# Patient Record
Sex: Male | Born: 1960 | Race: Black or African American | Hispanic: No | Marital: Married | Smoking: Never smoker
Health system: Southern US, Community
[De-identification: ages and names within clinical notes are randomized; demographics above are authoritative.]

---

## 1999-04-28 ENCOUNTER — Encounter (INDEPENDENT_AMBULATORY_CARE_PROVIDER_SITE_OTHER): Payer: Self-pay

## 1999-04-28 ENCOUNTER — Ambulatory Visit (HOSPITAL_COMMUNITY): Admission: RE | Admit: 1999-04-28 | Discharge: 1999-04-28 | Payer: Self-pay | Admitting: Gastroenterology

## 2005-01-05 ENCOUNTER — Ambulatory Visit: Payer: Self-pay | Admitting: Gastroenterology

## 2005-01-14 ENCOUNTER — Ambulatory Visit: Payer: Self-pay | Admitting: Gastroenterology

## 2009-02-08 ENCOUNTER — Emergency Department (HOSPITAL_COMMUNITY): Admission: EM | Admit: 2009-02-08 | Discharge: 2009-02-08 | Payer: Self-pay | Admitting: Emergency Medicine

## 2009-05-11 ENCOUNTER — Inpatient Hospital Stay (HOSPITAL_COMMUNITY): Admission: AC | Admit: 2009-05-11 | Discharge: 2009-05-13 | Payer: Self-pay

## 2009-05-13 ENCOUNTER — Encounter (INDEPENDENT_AMBULATORY_CARE_PROVIDER_SITE_OTHER): Payer: Self-pay | Admitting: General Surgery

## 2009-05-13 ENCOUNTER — Ambulatory Visit: Payer: Self-pay | Admitting: Vascular Surgery

## 2009-12-11 ENCOUNTER — Encounter (INDEPENDENT_AMBULATORY_CARE_PROVIDER_SITE_OTHER): Payer: Self-pay | Admitting: *Deleted

## 2010-05-13 NOTE — Letter (Signed)
Summary: Colonoscopy Letter  [facility]  [address] [place], [place] [address]   Phone: [phone]  Fax: [phone]      December 11, 2009 MRN: [id number]   [facility] [address] [place], [place]  [address]   Dear Christopher Mayer,   According to your medical record, it is time for you to schedule a Colonoscopy. The American Cancer Society recommends this procedure as a method to detect early colon cancer. Patients with a family history of colon cancer, or a personal history of colon polyps or inflammatory bowel disease are at increased risk.  This letter has beeen generated based on the recommendations made at the time of your procedure. If you feel that in your particular situation this may no longer apply, please contact our office.  Please call our office at [phone] to schedule this appointment or to update your records at your earliest convenience.  Thank you for cooperating with [place] to provide you with the very best care possible.   Sincerely,  Judie Petit T. Russella Dar, M.D.  [facility] [phone]

## 2010-06-30 LAB — CBC
HCT: 36.8 % — ABNORMAL LOW (ref 39.0–52.0)
HCT: 37.8 % — ABNORMAL LOW (ref 39.0–52.0)
Hemoglobin: 12.5 g/dL — ABNORMAL LOW (ref 13.0–17.0)
MCV: 91.7 fL (ref 78.0–100.0)
MCV: 92.1 fL (ref 78.0–100.0)
Platelets: 159 10*3/uL (ref 150–400)
Platelets: 167 10*3/uL (ref 150–400)
RBC: 4.11 MIL/uL — ABNORMAL LOW (ref 4.22–5.81)
WBC: 10 10*3/uL (ref 4.0–10.5)

## 2010-06-30 LAB — POCT I-STAT, CHEM 8
Calcium, Ion: 1.13 mmol/L (ref 1.12–1.32)
HCT: 41 % (ref 39.0–52.0)
Potassium: 3.3 meq/L — ABNORMAL LOW (ref 3.5–5.1)
TCO2: 22 mmol/L (ref 0–100)

## 2010-06-30 LAB — URINALYSIS, ROUTINE W REFLEX MICROSCOPIC
Bilirubin Urine: NEGATIVE
Ketones, ur: 15 mg/dL — AB
Nitrite: NEGATIVE
Protein, ur: NEGATIVE mg/dL
Specific Gravity, Urine: 1.018 (ref 1.005–1.030)

## 2010-06-30 LAB — BASIC METABOLIC PANEL
Calcium: 8.2 mg/dL — ABNORMAL LOW (ref 8.4–10.5)
Creatinine, Ser: 1.24 mg/dL (ref 0.4–1.5)
GFR calc non Af Amer: >60 mL/min (ref >60)

## 2010-06-30 LAB — GLUCOSE, CAPILLARY
Glucose-Capillary: 179 mg/dL — ABNORMAL HIGH (ref 70–99)
Glucose-Capillary: 193 mg/dL — ABNORMAL HIGH (ref 70–99)
Glucose-Capillary: 201 mg/dL — ABNORMAL HIGH (ref 70–99)
Glucose-Capillary: 243 mg/dL — ABNORMAL HIGH (ref 70–99)

## 2010-06-30 LAB — URINE CULTURE

## 2010-07-17 LAB — POCT I-STAT, CHEM 8
BUN: 5 mg/dL — ABNORMAL LOW (ref 6–23)
Calcium, Ion: 1.13 mmol/L (ref 1.12–1.32)
Chloride: 105 mEq/L (ref 96–112)
Creatinine, Ser: 1 mg/dL (ref 0.4–1.5)
HCT: 45 % (ref 39.0–52.0)

## 2010-07-17 LAB — COMPREHENSIVE METABOLIC PANEL
ALT: 25 U/L (ref 0–53)
Alkaline Phosphatase: 79 U/L (ref 39–117)
Calcium: 8.9 mg/dL (ref 8.4–10.5)
Chloride: 108 mEq/L (ref 96–112)
Creatinine, Ser: 1.09 mg/dL (ref 0.4–1.5)
Total Bilirubin: 1.2 mg/dL (ref 0.3–1.2)
Total Protein: 6.9 g/dL (ref 6.0–8.3)

## 2010-07-17 LAB — APTT: aPTT: 30 seconds (ref 24–37)

## 2010-07-17 LAB — CBC
MCV: 90.5 fL (ref 78.0–100.0)
Platelets: 225 10*3/uL (ref 150–400)
RDW: 14.3 % (ref 11.5–15.5)

## 2010-07-17 LAB — POCT CARDIAC MARKERS
CKMB, poc: <1 ng/mL — ABNORMAL LOW (ref 1.0–8.0)
CKMB, poc: <1 ng/mL — ABNORMAL LOW (ref 1.0–8.0)
Myoglobin, poc: 83.1 ng/mL (ref 12–200)
Troponin i, poc: <0.05 ng/mL (ref 0.00–0.09)
Troponin i, poc: <0.05 ng/mL (ref 0.00–0.09)

## 2010-07-17 LAB — CK TOTAL AND CKMB (NOT AT ARMC)
CK, MB: 1.1 ng/mL (ref 0.3–4.0)
Relative Index: 0.6 (ref 0.0–2.5)
Total CK: 196 U/L (ref 7–232)

## 2010-07-17 LAB — DIFFERENTIAL
Lymphs Abs: 1.3 10*3/uL (ref 0.7–4.0)
Neutro Abs: 7.4 10*3/uL (ref 1.7–7.7)
Neutrophils Relative %: 80 % — ABNORMAL HIGH (ref 43–77)

## 2010-07-17 LAB — LIPASE, BLOOD: Lipase: 20 U/L (ref 11–59)

## 2010-07-17 LAB — D-DIMER, QUANTITATIVE: D-Dimer, Quant: <0.22 ug/mL-FEU (ref 0.00–0.48)

## 2010-07-17 LAB — PROTIME-INR: INR: 0.96 (ref 0.00–1.49)
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
## 2011-12-31 ENCOUNTER — Encounter: Payer: Self-pay | Admitting: Gastroenterology

## 2013-09-28 ENCOUNTER — Encounter: Payer: Self-pay | Admitting: Gastroenterology

## 2020-03-05 ENCOUNTER — Encounter (INDEPENDENT_AMBULATORY_CARE_PROVIDER_SITE_OTHER): Payer: Self-pay | Admitting: Ophthalmology

## 2020-03-05 ENCOUNTER — Other Ambulatory Visit: Payer: Self-pay

## 2020-03-05 ENCOUNTER — Ambulatory Visit (INDEPENDENT_AMBULATORY_CARE_PROVIDER_SITE_OTHER): Payer: Commercial Managed Care - PPO | Admitting: Ophthalmology

## 2020-03-05 DIAGNOSIS — R0683 Snoring: Secondary | ICD-10-CM | POA: Diagnosis not present

## 2020-03-05 DIAGNOSIS — H35033 Hypertensive retinopathy, bilateral: Secondary | ICD-10-CM | POA: Diagnosis not present

## 2020-03-05 DIAGNOSIS — E113412 Type 2 diabetes mellitus with severe nonproliferative diabetic retinopathy with macular edema, left eye: Secondary | ICD-10-CM | POA: Diagnosis not present

## 2020-03-05 DIAGNOSIS — E113491 Type 2 diabetes mellitus with severe nonproliferative diabetic retinopathy without macular edema, right eye: Secondary | ICD-10-CM | POA: Insufficient documentation

## 2020-03-05 NOTE — Assessment & Plan Note (Signed)
CSME OS, at non center involved, will need fluorescein angiography in the near future

## 2020-03-05 NOTE — Assessment & Plan Note (Addendum)
Patient snores, has nocturia, and with labile hypertension  Recommend sleep apnea evaluation

## 2020-03-05 NOTE — Progress Notes (Signed)
03/05/2020     CHIEF COMPLAINT Patient presents for Diabetic Eye Exam   HISTORY OF PRESENT ILLNESS: Christopher Mayer is a 59 y.o. male who presents to the clinic today for:   HPI    Diabetic Eye Exam    Vision is blurred for near and is blurred for distance.  Diabetes characteristics include Type 2.  This started 3 weeks ago.  Blood sugar level is uncontrolled.  Last Blood Glucose: Does not recall.  Last A1C: Unknown, "I know it was high.".          Comments    NP Diabetic Exam OU  Pt c/o difficulty focusing OU with new glasses x 2-3 weeks. Pt reports uncontrolled blood sugar, and sts A1c "was high."        Last edited by Ileana Roup, COA on 03/05/2020  9:01 AM. (History)      Referring physician: N/A  HISTORICAL INFORMATION:   Selected notes from the MEDICAL RECORD NUMBER       CURRENT MEDICATIONS: No current outpatient medications on file. (Ophthalmic Drugs)   No current facility-administered medications for this visit. (Ophthalmic Drugs)   No current outpatient medications on file. (Other)   No current facility-administered medications for this visit. (Other)      REVIEW OF SYSTEMS:    ALLERGIES No Known Allergies  PAST MEDICAL HISTORY History reviewed. No pertinent past medical history. History reviewed. No pertinent surgical history.  FAMILY HISTORY History reviewed. No pertinent family history.  SOCIAL HISTORY Social History   Tobacco Use  . Smoking status: Never Smoker  . Smokeless tobacco: Never Used  Substance Use Topics  . Alcohol use: Not on file  . Drug use: Not on file         OPHTHALMIC EXAM:  Base Eye Exam    Visual Acuity (ETDRS)      Right Left   Dist cc 20/20 -2 20/20 -1   Correction: Glasses       Tonometry (Tonopen, 9:01 AM)      Right Left   Pressure 22 21       Pupils      Pupils Dark Light Shape React APD   Right PERRL 6 5 Round Brisk None   Left PERRL 6 5 Round Brisk None       Visual Fields  (Counting fingers)      Left Right    Full Full       Extraocular Movement      Right Left    Full Full       Neuro/Psych    Oriented x3: Yes   Mood/Affect: Normal       Dilation    Both eyes: 1.0% Mydriacyl, 2.5% Phenylephrine @ 9:05 AM        Slit Lamp and Fundus Exam    External Exam      Right Left   External Normal Normal       Slit Lamp Exam      Right Left   Lids/Lashes Normal Normal   Conjunctiva/Sclera White and quiet White and quiet   Cornea Clear Clear   Anterior Chamber Deep and quiet Deep and quiet   Iris Round and reactive Round and reactive   Anterior Vitreous Normal Normal       Fundus Exam      Right Left   Posterior Vitreous Normal Normal   Disc Normal Normal   C/D Ratio 0.5 0.5   Macula Microaneurysms, Mild clinically significant  macular edema, Exudates Microaneurysms, Mild clinically significant macular edema, Exudates   Vessels NPDR-Severe, Retinopathy, Tortuous, AV nicking NPDR-Severe, Retinopathy, Tortuous, AV nicking   Periphery Cotton wool spots  Numerous, in posterior pole Cotton wool spots  Numerous, in posterior pole          IMAGING AND PROCEDURES  Imaging and Procedures for 03/05/20  OCT, Retina - OU - Both Eyes       Right Eye Quality was good. Scan locations included subfoveal. Central Foveal Thickness: 292. Findings include abnormal foveal contour, retinal drusen .   Left Eye Quality was good. Scan locations included subfoveal. Central Foveal Thickness: 285. Progression has no prior data. Findings include abnormal foveal contour, retinal drusen .   Notes OS with noncentral involved CSME superior to the fovea.  Will need for cineangiography OU and likely focal laser photocoagulation left eye at that time                ASSESSMENT/PLAN:  Severe nonproliferative diabetic retinopathy of left eye with macular edema associated with type 2 diabetes mellitus (HCC) CSME OS, at non center involved, will need  fluorescein angiography in the near future  Hypertensive retinopathy of both eyes, grade 2 Detected on clinical examination with numerous multiple posterior pole cotton-wool spots in each eye.  I also suspect this patient may have undiagnosed sleep apnea as review of systems are somewhat positive for that condition and this is the appearance seen in patients with severe nonproliferative diabetic retinopathy with multiple cotton-wool spots posteriorly  Snores Patient snores, has nocturia, and with labile hypertension  Recommend sleep apnea evaluation      ICD-10-CM   1. Severe nonproliferative diabetic retinopathy of right eye without macular edema associated with type 2 diabetes mellitus (HCC)  E11.3491 OCT, Retina - OU - Both Eyes  2. Severe nonproliferative diabetic retinopathy of left eye with macular edema associated with type 2 diabetes mellitus (HCC)  E11.3412 OCT, Retina - OU - Both Eyes  3. Hypertensive retinopathy of both eyes, grade 2  H35.033 OCT, Retina - OU - Both Eyes  4. Snores  R06.83     1.  Patient will seek consultation with his primary care physician the physician formally known as Dr. Otilio Miu, , now married,  (in [place])  2.  Patient to return for dilated examination OU fluorescein angiography OU, and consideration of focal laser treatment left eye  3.  Ophthalmic Meds Ordered this visit:  No orders of the defined types were placed in this encounter.      Return in about 2 weeks (around 03/19/2020) for DILATE OU, COLOR FP, OPTOS FFA L/R, FOCAL, OS.  There are no Patient Instructions on file for this visit.   Explained the diagnoses, plan, and follow up with the patient and they expressed understanding.  Patient expressed understanding of the importance of proper follow up care.   Alford Highland Henya Aguallo M.D. Diseases & Surgery of the Retina and Vitreous [facility] 03/05/20     Abbreviations: M myopia (nearsighted); A  astigmatism; H hyperopia (farsighted); P presbyopia; Mrx spectacle prescription;  CTL contact lenses; OD right eye; OS left eye; OU both eyes  XT exotropia; ET esotropia; PEK punctate epithelial keratitis; PEE punctate epithelial erosions; DES dry eye syndrome; MGD meibomian gland dysfunction; ATs artificial tears; PFAT's preservative free artificial tears; NSC nuclear sclerotic cataract; PSC posterior subcapsular cataract; ERM epi-retinal membrane; PVD posterior vitreous detachment; RD retinal detachment; DM diabetes mellitus; DR diabetic retinopathy; NPDR  non-proliferative diabetic retinopathy; PDR proliferative diabetic retinopathy; CSME clinically significant macular edema; DME diabetic macular edema; dbh dot blot hemorrhages; CWS cotton wool spot; POAG primary open angle glaucoma; C/D cup-to-disc ratio; HVF humphrey visual field; GVF goldmann visual field; OCT optical coherence tomography; IOP intraocular pressure; BRVO Branch retinal vein occlusion; CRVO central retinal vein occlusion; CRAO central retinal artery occlusion; BRAO branch retinal artery occlusion; RT retinal tear; SB scleral buckle; PPV pars plana vitrectomy; VH Vitreous hemorrhage; PRP panretinal laser photocoagulation; IVK intravitreal kenalog; VMT vitreomacular traction; MH Macular hole;  NVD neovascularization of the disc; NVE neovascularization elsewhere; AREDS age related eye disease study; ARMD age related macular degeneration; POAG primary open angle glaucoma; EBMD epithelial/anterior basement membrane dystrophy; ACIOL anterior chamber intraocular lens; IOL intraocular lens; PCIOL posterior chamber intraocular lens; Phaco/IOL phacoemulsification with intraocular lens placement; [place] photorefractive keratectomy; LASIK laser assisted in situ keratomileusis; HTN hypertension; DM diabetes mellitus; COPD chronic obstructive pulmonary disease

## 2020-03-05 NOTE — Assessment & Plan Note (Signed)
Detected on clinical examination with numerous multiple posterior pole cotton-wool spots in each eye.  I also suspect this patient may have undiagnosed sleep apnea as review of systems are somewhat positive for that condition and this is the appearance seen in patients with severe nonproliferative diabetic retinopathy with multiple cotton-wool spots posteriorly

## 2020-03-19 ENCOUNTER — Encounter (INDEPENDENT_AMBULATORY_CARE_PROVIDER_SITE_OTHER): Payer: Self-pay | Admitting: Ophthalmology

## 2020-03-19 ENCOUNTER — Other Ambulatory Visit: Payer: Self-pay

## 2020-03-19 ENCOUNTER — Ambulatory Visit (INDEPENDENT_AMBULATORY_CARE_PROVIDER_SITE_OTHER): Payer: Commercial Managed Care - PPO | Admitting: Ophthalmology

## 2020-03-19 DIAGNOSIS — E113412 Type 2 diabetes mellitus with severe nonproliferative diabetic retinopathy with macular edema, left eye: Secondary | ICD-10-CM | POA: Diagnosis not present

## 2020-03-19 DIAGNOSIS — E113491 Type 2 diabetes mellitus with severe nonproliferative diabetic retinopathy without macular edema, right eye: Secondary | ICD-10-CM

## 2020-03-19 DIAGNOSIS — R0683 Snoring: Secondary | ICD-10-CM

## 2020-03-19 DIAGNOSIS — H35033 Hypertensive retinopathy, bilateral: Secondary | ICD-10-CM | POA: Diagnosis not present

## 2020-03-19 MED ORDER — FLUORESCEIN SODIUM 10 % IV SOLN
500.0000 mg | INTRAVENOUS | Status: AC | PRN
  Administered 2020-03-19: 500 mg via INTRAVENOUS

## 2020-03-19 NOTE — Assessment & Plan Note (Signed)
Also with superimposed hypertensive retinopathy.

## 2020-03-19 NOTE — Assessment & Plan Note (Signed)
Focal laser treatment OS today

## 2020-03-19 NOTE — Assessment & Plan Note (Signed)
I reiterated that the patient has positive review of systems for sleep apnea and should be tested.  I informed the patient that should his testing disclosed borderline sleep apnea or worse, treatment is likely to benefit his diabetes control, hypertension control and most importantly slow diabetic retinopathy in addition to his ongoing therapy here

## 2020-03-19 NOTE — Assessment & Plan Note (Signed)
cotton-wool spots of hypertensive retinopathy.  The posterior pole this does suggest superimposed hypertension on diabetic retinopathy.  This is most often seen in patients who have normal-tension during the day as he reports and likely has untreated or undiagnosed sleep apnea with periodic nightly hypertension triggering these types of capillary nonperfusion posteriorly.  I urged testing for sleep apnea and therapy if found to be present

## 2020-03-19 NOTE — Progress Notes (Signed)
03/19/2020     CHIEF COMPLAINT Patient presents for Retina Follow Up   HISTORY OF PRESENT ILLNESS: Christopher Mayer is a 59 y.o. male who presents to the clinic today for:   HPI    Retina Follow Up    Patient presents with  Diabetic Retinopathy.  In both eyes.  This started 2 weeks ago.  Severity is mild.  Duration of 2 weeks.  Since onset it is stable.          Comments    2 Week FFA/FP L/R, Focal OS  Pt denies noticeable changes to [place] OU since last visit. Pt denies ocular pain, flashes of light, or floaters OU.  LBS: does not recall       Last edited by Ileana Roup, COA on 03/19/2020  3:49 PM. (History)      Referring physician: No referring provider defined for this encounter.  HISTORICAL INFORMATION:   Selected notes from the MEDICAL RECORD NUMBER       CURRENT MEDICATIONS: No current outpatient medications on file. (Ophthalmic Drugs)   No current facility-administered medications for this visit. (Ophthalmic Drugs)   No current outpatient medications on file. (Other)   No current facility-administered medications for this visit. (Other)      REVIEW OF SYSTEMS:    ALLERGIES No Known Allergies  PAST MEDICAL HISTORY History reviewed. No pertinent past medical history. History reviewed. No pertinent surgical history.  FAMILY HISTORY History reviewed. No pertinent family history.  SOCIAL HISTORY Social History   Tobacco Use  . Smoking status: Never Smoker  . Smokeless tobacco: Never Used  Substance Use Topics  . Alcohol use: Not on file  . Drug use: Not on file         OPHTHALMIC EXAM: Base Eye Exam    Visual Acuity (ETDRS)      Right Left   Dist cc 20/25 +2 20/25 -2   Correction: Glasses       Tonometry (Tonopen, 3:49 PM)      Right Left   Pressure 20 18       Pupils      Pupils Dark Light Shape React APD   Right PERRL 6 5 Round Brisk None   Left PERRL 6 5 Round Brisk None       Visual Fields (Counting fingers)       Left Right    Full Full       Extraocular Movement      Right Left    Full Full       Neuro/Psych    Oriented x3: Yes   Mood/Affect: Normal       Dilation    Both eyes: 1.0% Mydriacyl, 2.5% Phenylephrine @ 3:53 PM          IMAGING AND PROCEDURES  Imaging and Procedures for 03/19/20  OCT, Retina - OU - Both Eyes       Right Eye Quality was good. Scan locations included subfoveal.   Left Eye Quality was good. Scan locations included subfoveal.   Notes For focal laser treatment today, OS superior to the fovea today       Color Fundus Photography Optos - OU - Both Eyes       Right Eye Progression has no prior data.   Left Eye Progression has no prior data.   Notes Too numerous to count OU, cotton-wool spots of hypertensive retinopathy.  The posterior pole this does suggest superimposed hypertension on diabetic retinopathy.  This is  most often seen in patients who have normal-tension during the day as he reports and likely has untreated or undiagnosed sleep apnea with periodic nightly hypertension triggering these types of capillary nonperfusion posteriorly.  I urged testing for sleep apnea and therapy if found to be present       Fluorescein Angiography Optos (Transit OS)       Injection:  500 mg Fluorescein Sodium 10 % injection   NDC: [phone]   Route: Intravenous, Site: Left ArmRight Eye   Progression has no prior data. Mid/Late phase findings include leakage. Choroidal neovascularization is not present.   Left Eye   Progression has no prior data. Early phase findings include leakage, vascular perfusion defect. Mid/Late phase findings include microaneurysm. Choroidal neovascularization is not present.   Notes CSME OS confirm  Numerous locations of capillary dropout correspond with cotton-wool spots seen on color fundus photography mostly in the posterior pole.  This pattern is most likely from superimposed hypertensive retinopathy upon  the severe nonproliferative diabetic retinopathy.  Particularly associated with UNDiagnosed and untreated sleep apnea                ASSESSMENT/PLAN:  Hypertensive retinopathy of both eyes, grade 2  cotton-wool spots of hypertensive retinopathy.  The posterior pole this does suggest superimposed hypertension on diabetic retinopathy.  This is most often seen in patients who have normal-tension during the day as he reports and likely has untreated or undiagnosed sleep apnea with periodic nightly hypertension triggering these types of capillary nonperfusion posteriorly.  I urged testing for sleep apnea and therapy if found to be present   Severe nonproliferative diabetic retinopathy of left eye with macular edema associated with type 2 diabetes mellitus (HCC) Focal laser treatment OS today  Severe nonproliferative diabetic retinopathy of right eye (HCC) Also with superimposed hypertensive retinopathy.  Snores I reiterated that the patient has positive review of systems for sleep apnea and should be tested.  I informed the patient that should his testing disclosed borderline sleep apnea or worse, treatment is likely to benefit his diabetes control, hypertension control and most importantly slow diabetic retinopathy in addition to his ongoing therapy here      ICD-10-CM   1. Severe nonproliferative diabetic retinopathy of left eye with macular edema associated with type 2 diabetes mellitus (HCC)  E11.3412 OCT, Retina - OU - Both Eyes    Color Fundus Photography Optos - OU - Both Eyes    Fluorescein Angiography Optos (Transit OS)    Focal Laser - OS - Left Eye    Fluorescein Sodium 10 % injection 500 mg  2. Severe nonproliferative diabetic retinopathy of right eye without macular edema associated with type 2 diabetes mellitus (HCC)  E11.3491 OCT, Retina - OU - Both Eyes    Color Fundus Photography Optos - OU - Both Eyes    Fluorescein Angiography Optos (Transit OS)    Fluorescein  Sodium 10 % injection 500 mg  3. Hypertensive retinopathy of both eyes, grade 2  H35.033   4. Snores  R06.83     1.  I demonstrated to the patient the hypertensive retinopathy changes seen on color fundus as well as on the fluorescein angiography and informed him that should these changes occur in and around the center of the vision, loss of blood supply to these areas from that we have not be recoverable with permanent.  This reason I have encouraged follow-up evaluation with sleep studies and continued blood sugar sugar and blood pressure monitoring and  control  2.  Focal laser delivered left eye superiorly today  3.  Patient referred for follow-up with his primary care physician to push and arrange for sleep testing  Ophthalmic Meds Ordered this visit:  Meds ordered this encounter  Medications  . Fluorescein Sodium 10 % injection 500 mg       Return in about 4 months (around 07/18/2020) for DILATE OU, COLOR FP, OCT.  There are no Patient Instructions on file for this visit.   Explained the diagnoses, plan, and follow up with the patient and they expressed understanding.  Patient expressed understanding of the importance of proper follow up care.   Alford Highland Desirre Eickhoff M.D. Diseases & Surgery of the Retina and Vitreous [facility] 03/19/20     Abbreviations: M myopia (nearsighted); A astigmatism; H hyperopia (farsighted); P presbyopia; Mrx spectacle prescription;  CTL contact lenses; OD right eye; OS left eye; OU both eyes  XT exotropia; ET esotropia; PEK punctate epithelial keratitis; PEE punctate epithelial erosions; DES dry eye syndrome; MGD meibomian gland dysfunction; ATs artificial tears; PFAT's preservative free artificial tears; NSC nuclear sclerotic cataract; PSC posterior subcapsular cataract; ERM epi-retinal membrane; PVD posterior vitreous detachment; RD retinal detachment; DM diabetes mellitus; DR diabetic retinopathy; NPDR non-proliferative diabetic  retinopathy; PDR proliferative diabetic retinopathy; CSME clinically significant macular edema; DME diabetic macular edema; dbh dot blot hemorrhages; CWS cotton wool spot; POAG primary open angle glaucoma; C/D cup-to-disc ratio; HVF humphrey visual field; GVF goldmann visual field; OCT optical coherence tomography; IOP intraocular pressure; BRVO Branch retinal vein occlusion; CRVO central retinal vein occlusion; CRAO central retinal artery occlusion; BRAO branch retinal artery occlusion; RT retinal tear; SB scleral buckle; PPV pars plana vitrectomy; VH Vitreous hemorrhage; PRP panretinal laser photocoagulation; IVK intravitreal kenalog; VMT vitreomacular traction; MH Macular hole;  NVD neovascularization of the disc; NVE neovascularization elsewhere; AREDS age related eye disease study; ARMD age related macular degeneration; POAG primary open angle glaucoma; EBMD epithelial/anterior basement membrane dystrophy; ACIOL anterior chamber intraocular lens; IOL intraocular lens; PCIOL posterior chamber intraocular lens; Phaco/IOL phacoemulsification with intraocular lens placement; PRK photorefractive keratectomy; LASIK laser assisted in situ keratomileusis; HTN hypertension; DM diabetes mellitus; COPD chronic obstructive pulmonary disease

## 2020-06-12 ENCOUNTER — Encounter (INDEPENDENT_AMBULATORY_CARE_PROVIDER_SITE_OTHER): Payer: Self-pay

## 2020-07-23 ENCOUNTER — Encounter (INDEPENDENT_AMBULATORY_CARE_PROVIDER_SITE_OTHER): Payer: Commercial Managed Care - PPO | Admitting: Ophthalmology
